# Patient Record
Sex: Female | Born: 1980 | Hispanic: Yes | Marital: Married | Smoking: Never smoker
Health system: Southern US, Community
[De-identification: ages and names within clinical notes are randomized; demographics above are authoritative.]

## PROBLEM LIST (undated history)

## (undated) DIAGNOSIS — M419 Scoliosis, unspecified: Secondary | ICD-10-CM

## (undated) HISTORY — DX: Scoliosis, unspecified: M41.9

## (undated) HISTORY — PX: TUBAL LIGATION: SHX77

---

## 2010-10-16 ENCOUNTER — Emergency Department: Payer: Self-pay | Admitting: Emergency Medicine

## 2018-04-09 ENCOUNTER — Ambulatory Visit
Admission: RE | Admit: 2018-04-09 | Discharge: 2018-04-09 | Disposition: A | Discharge status: Home / Self Care | Payer: Self-pay | Source: Ambulatory Visit | Attending: Student | Admitting: Student

## 2018-04-09 ENCOUNTER — Other Ambulatory Visit: Payer: Self-pay | Admitting: Student

## 2018-04-09 DIAGNOSIS — M545 Low back pain, unspecified: Secondary | ICD-10-CM

## 2018-04-09 DIAGNOSIS — M419 Scoliosis, unspecified: Secondary | ICD-10-CM

## 2018-04-09 DIAGNOSIS — G8929 Other chronic pain: Secondary | ICD-10-CM | POA: Insufficient documentation

## 2018-04-09 DIAGNOSIS — M5442 Lumbago with sciatica, left side: Secondary | ICD-10-CM

## 2020-06-18 ENCOUNTER — Other Ambulatory Visit: Payer: Self-pay | Admitting: Nurse Practitioner

## 2020-06-18 DIAGNOSIS — M419 Scoliosis, unspecified: Secondary | ICD-10-CM

## 2020-08-02 ENCOUNTER — Ambulatory Visit
Admission: RE | Admit: 2020-08-02 | Discharge: 2020-08-02 | Disposition: A | Discharge status: Home / Self Care | Payer: Self-pay | Source: Ambulatory Visit | Attending: Nurse Practitioner | Admitting: Nurse Practitioner

## 2020-08-02 DIAGNOSIS — M419 Scoliosis, unspecified: Secondary | ICD-10-CM | POA: Insufficient documentation

## 2021-08-09 ENCOUNTER — Other Ambulatory Visit: Payer: Self-pay | Admitting: Neurosurgery

## 2021-08-09 DIAGNOSIS — M419 Scoliosis, unspecified: Secondary | ICD-10-CM

## 2021-08-30 ENCOUNTER — Ambulatory Visit
Admission: RE | Admit: 2021-08-30 | Discharge: 2021-08-30 | Disposition: A | Discharge status: Home / Self Care | Payer: Self-pay | Attending: Neurosurgery | Admitting: Neurosurgery

## 2021-08-30 ENCOUNTER — Ambulatory Visit
Admission: RE | Admit: 2021-08-30 | Discharge: 2021-08-30 | Disposition: A | Discharge status: Home / Self Care | Payer: Self-pay | Source: Ambulatory Visit | Attending: Neurosurgery | Admitting: Neurosurgery

## 2021-08-30 DIAGNOSIS — M419 Scoliosis, unspecified: Secondary | ICD-10-CM

## 2021-09-18 ENCOUNTER — Other Ambulatory Visit: Payer: Self-pay | Admitting: Internal Medicine

## 2021-09-18 ENCOUNTER — Other Ambulatory Visit: Payer: Self-pay | Admitting: Obstetrics and Gynecology

## 2021-09-18 DIAGNOSIS — Z1231 Encounter for screening mammogram for malignant neoplasm of breast: Secondary | ICD-10-CM

## 2021-09-27 ENCOUNTER — Ambulatory Visit
Admission: RE | Admit: 2021-09-27 | Discharge: 2021-09-27 | Disposition: A | Discharge status: Home / Self Care | Payer: Self-pay | Source: Ambulatory Visit | Attending: Obstetrics and Gynecology | Admitting: Obstetrics and Gynecology

## 2021-09-27 ENCOUNTER — Other Ambulatory Visit: Payer: Self-pay | Admitting: Obstetrics and Gynecology

## 2021-09-27 ENCOUNTER — Encounter: Payer: Self-pay | Admitting: Radiology

## 2021-09-27 DIAGNOSIS — Z1231 Encounter for screening mammogram for malignant neoplasm of breast: Secondary | ICD-10-CM | POA: Insufficient documentation

## 2021-10-03 ENCOUNTER — Other Ambulatory Visit: Payer: Self-pay | Admitting: Obstetrics and Gynecology

## 2021-10-03 DIAGNOSIS — R928 Other abnormal and inconclusive findings on diagnostic imaging of breast: Secondary | ICD-10-CM

## 2021-10-03 DIAGNOSIS — N6489 Other specified disorders of breast: Secondary | ICD-10-CM

## 2021-10-16 ENCOUNTER — Ambulatory Visit
Admission: RE | Admit: 2021-10-16 | Discharge: 2021-10-16 | Disposition: A | Discharge status: Home / Self Care | Payer: Self-pay | Source: Ambulatory Visit | Attending: Obstetrics and Gynecology | Admitting: Obstetrics and Gynecology

## 2021-10-16 DIAGNOSIS — N6489 Other specified disorders of breast: Secondary | ICD-10-CM | POA: Insufficient documentation

## 2021-10-16 DIAGNOSIS — R928 Other abnormal and inconclusive findings on diagnostic imaging of breast: Secondary | ICD-10-CM

## 2022-04-03 ENCOUNTER — Other Ambulatory Visit: Payer: Self-pay | Admitting: Obstetrics and Gynecology

## 2022-04-03 DIAGNOSIS — N6489 Other specified disorders of breast: Secondary | ICD-10-CM

## 2022-04-25 ENCOUNTER — Ambulatory Visit
Admission: RE | Admit: 2022-04-25 | Discharge: 2022-04-25 | Disposition: A | Discharge status: Home / Self Care | Payer: Self-pay | Source: Ambulatory Visit | Attending: Obstetrics and Gynecology | Admitting: Obstetrics and Gynecology

## 2022-04-25 DIAGNOSIS — N6489 Other specified disorders of breast: Secondary | ICD-10-CM

## 2022-08-06 ENCOUNTER — Telehealth: Payer: Self-pay

## 2022-08-06 DIAGNOSIS — M419 Scoliosis, unspecified: Secondary | ICD-10-CM

## 2022-08-06 NOTE — Telephone Encounter (Signed)
The patient is due to have her scoliosis xrays repeated in March 2024. Can you please let her know that [place] [facility], she will not need an appt and can go anytime Monday through Friday, 8am to 5pm.

## 2022-08-07 NOTE — Telephone Encounter (Signed)
Patient is aware to go for her xrays in March. We will then call her with the results or to schedule in-person visit.

## 2022-09-03 NOTE — Telephone Encounter (Signed)
No xrays yet.

## 2022-09-12 ENCOUNTER — Ambulatory Visit
Admission: RE | Admit: 2022-09-12 | Discharge: 2022-09-12 | Disposition: A | Discharge status: Home / Self Care | Payer: Self-pay | Attending: Neurosurgery | Admitting: Neurosurgery

## 2022-09-12 ENCOUNTER — Ambulatory Visit
Admission: RE | Admit: 2022-09-12 | Discharge: 2022-09-12 | Disposition: A | Discharge status: Home / Self Care | Payer: Self-pay | Source: Ambulatory Visit | Attending: Neurosurgery | Admitting: Neurosurgery

## 2022-09-12 DIAGNOSIS — M419 Scoliosis, unspecified: Secondary | ICD-10-CM | POA: Insufficient documentation

## 2022-09-15 NOTE — Telephone Encounter (Signed)
Patient has had her repeat scoliosis xrays that were due to be repeated in March 2024.

## 2022-09-15 NOTE — Telephone Encounter (Signed)
She had her xrays on 09/12/2022

## 2022-09-17 NOTE — Telephone Encounter (Signed)
Left message for patient to call the office for a telephone or in-person visit.

## 2022-09-18 NOTE — Telephone Encounter (Signed)
She requested to come in person to review xray results 09/23/2022.

## 2022-09-23 ENCOUNTER — Encounter: Payer: Self-pay | Admitting: Neurosurgery

## 2022-09-23 ENCOUNTER — Ambulatory Visit (INDEPENDENT_AMBULATORY_CARE_PROVIDER_SITE_OTHER): Payer: Self-pay | Admitting: Neurosurgery

## 2022-09-23 VITALS — BP 115/76 | HR 67 | Ht 59.0 in | Wt 153.0 lb

## 2022-09-23 DIAGNOSIS — M41117 Juvenile idiopathic scoliosis, lumbosacral region: Secondary | ICD-10-CM

## 2022-09-23 NOTE — Progress Notes (Signed)
Referring Physician:  No referring provider defined for this encounter.  Primary Physician:  Tracie Harrier, MD  History of Present Illness: 09/23/2022 Claudia Johnson is here today with a chief complaint of known scoliosis.  She is having slight increase in her back discomfort, but is still able to do what she needs to do.    I have utilized the care everywhere function in epic to review the outside records available from external health systems.  09/13/2021 Claudia Johnson returns to see me. She continues to have some back pain. She has occasional pain in the left lower back particularly at the end of the day. Overall, she is not currently limited in her day-to-day life.  09/06/2020 Claudia Johnson is here today with a chief complaint of low back pain and left buttock pain that radiates into the groin and down the anterior leg into the foot.   She has had pain for a year. She is known she had scoliosis for some time. Her pain can be as bad as 8 out of 10 is made worse by walking. She is able to walk approximately 2 Johnson before her pain sets in. Prior to that, she is relatively pain-free. She is able to do everything she currently needs to do.  Conservative measures:  Physical therapy: has not participated in Multimodal medical therapy including regular antiinflammatories: meloxicam,flexeril Injections: has not had epidural steroid injections  Past Surgery: none   Review of Systems:  A 10 point review of systems is negative, except for the pertinent positives and negatives detailed in the HPI.  Past Medical History: No past medical history on file.  Past Surgical History: No past surgical history on file.  Allergies: Allergies as of 09/23/2022   (Not on File)    Medications: Current Meds  Medication Sig   baclofen (LIORESAL) 10 MG tablet Take 10 mg by mouth 3 (three) times daily.   Vitamin D, Ergocalciferol, (DRISDOL) 1.25 MG (50000 UNIT) CAPS capsule  Take 50,000 Units by mouth once a week.    Social History: Social History   Tobacco Use   Smoking status: Never   Smokeless tobacco: Never    Family Medical History: No family history on file.  Physical Examination: Vitals:   09/23/22 1035  BP: 115/76  Pulse: 67  SpO2: 99%    General: Patient is well developed, well nourished, calm, collected, and in no apparent distress. Attention to examination is appropriate.  Neck:   Supple.  Full range of motion.  Respiratory: Patient is breathing without any difficulty.   NEUROLOGICAL:     Awake, alert, oriented to person, place, and time.  Speech is clear and fluent.   Cranial Nerves: Pupils equal round and reactive to light.  Facial tone is symmetric.  Facial sensation is symmetric. Shoulder shrug is symmetric. Tongue protrusion is midline.  There is no pronator drift.  ROM of spine: full.    Strength: Side Biceps Triceps Deltoid Interossei Grip Wrist Ext. Wrist Flex.  R 5 5 5 5 5 5 5   L 5 5 5 5 5 5 5    Side Iliopsoas Quads Hamstring PF DF EHL  R 5 5 5 5 5 5   L 5 5 5 5 5 5    Reflexes are 1+ and symmetric at the biceps, triceps, brachioradialis, patella and achilles.   Hoffman's is absent.   Bilateral upper and lower extremity sensation is intact to light touch.    No evidence of dysmetria noted.  Gait  is normal.     Medical Decision Making  Imaging: Scoliosis xrays 09/16/2022 IMPRESSION: 1. Loss of normal cervical lordosis again noted. 2. Stable 25 degree scoliosis lumbar spine concave right. Stable 11 degree scoliosis concave left lower thoracic spine. 3. 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 4. No acute bony abnormalities.  Exam is unchanged from prior exam.     Electronically Signed   By: Marcello Moores  Register M.D.   On: 09/16/2022 06:37      I have personally reviewed the images and agree with the above interpretation.  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  She is having slightly more symptoms than she did previously.  However, she is still relatively asymptomatic.  Will follow-up with x-rays in 1 year.  I will see her back after her x-rays.  If her pain worsens in the meantime, we will start her on physical therapy.    Thank you for involving me in the care of this patient.      Claudia Johnson K. Izora Ribas MD, [facility] Neurosurgery

## 2022-10-09 ENCOUNTER — Other Ambulatory Visit: Payer: Self-pay

## 2022-10-09 DIAGNOSIS — N6489 Other specified disorders of breast: Secondary | ICD-10-CM

## 2022-10-28 ENCOUNTER — Other Ambulatory Visit: Payer: Self-pay | Admitting: Obstetrics and Gynecology

## 2022-10-28 ENCOUNTER — Ambulatory Visit: Payer: Self-pay | Attending: Hematology and Oncology | Admitting: Hematology and Oncology

## 2022-10-28 ENCOUNTER — Ambulatory Visit
Admission: RE | Admit: 2022-10-28 | Discharge: 2022-10-28 | Disposition: A | Discharge status: Home / Self Care | Payer: Self-pay | Source: Ambulatory Visit | Attending: Obstetrics and Gynecology | Admitting: Obstetrics and Gynecology

## 2022-10-28 VITALS — BP 106/68 | Wt 155.1 lb

## 2022-10-28 DIAGNOSIS — N63 Unspecified lump in unspecified breast: Secondary | ICD-10-CM

## 2022-10-28 DIAGNOSIS — N6489 Other specified disorders of breast: Secondary | ICD-10-CM

## 2022-10-28 DIAGNOSIS — R928 Other abnormal and inconclusive findings on diagnostic imaging of breast: Secondary | ICD-10-CM

## 2022-10-28 NOTE — Progress Notes (Signed)
Ms. Claudia Johnson is a 42 y.o. female who presents to [facility] clinic today with no complaints. Scheduled follow up of probable benign right breast mass.   Pap Smear: Pap not smear completed today. Last Pap smear was 09/14/2020 at [facility] clinic and was normal. Per patient has no history of an abnormal Pap smear. Last Pap smear result is available in Epic.   Physical exam: Breasts Breasts symmetrical. No skin abnormalities bilateral breasts. No nipple retraction bilateral breasts. No nipple discharge bilateral breasts. No lymphadenopathy. No lumps palpated left breasts. Right breast with mass noted at 10 o'clock.  MM DIAG BREAST TOMO UNI RIGHT  Result Date: 04/25/2022 CLINICAL DATA:  42 year old female presenting for first six-month follow-up of a probably benign right breast mass. EXAM: DIGITAL DIAGNOSTIC UNILATERAL RIGHT MAMMOGRAM WITH TOMOSYNTHESIS; ULTRASOUND RIGHT BREAST LIMITED TECHNIQUE: Right digital diagnostic mammography and breast tomosynthesis was performed.; Targeted ultrasound examination of the right breast was performed COMPARISON:  Previous exam(s). ACR Breast Density Category c: The breast tissue is heterogeneously dense, which may obscure small masses. FINDINGS: Stable appearance of a circumscribed mass in the upper outer right breast at anterior depth. Otherwise, no new or suspicious findings. The parenchymal pattern is stable. Targeted ultrasound is performed, showing grossly stable appearance of an oval, circumscribed hypoechoic mass at the 10 o'clock position 3 cm from the nipple. Today it measures 10 x 5 x 10 mm (previously 8 x 5 x 9 mm). Slight variations are due to differences in positioning and technique. IMPRESSION: Stable, probably benign right breast mass. Recommend continued short-term follow-up. RECOMMENDATION: Bilateral diagnostic mammogram and right breast ultrasound in 6 months. I have discussed the findings and recommendations with the patient. If applicable, a reminder  letter will be sent to the patient regarding the next appointment. BI-RADS CATEGORY  3: Probably benign. Electronically Signed   By: Sande Brothers M.D.   On: 04/25/2022 15:30  MM DIAG BREAST TOMO UNI RIGHT  Result Date: 10/16/2021 CLINICAL DATA:  Callback for RIGHT breast asymmetry off of baseline mammogram EXAM: DIGITAL DIAGNOSTIC UNILATERAL RIGHT MAMMOGRAM WITH TOMOSYNTHESIS AND CAD; ULTRASOUND RIGHT BREAST LIMITED TECHNIQUE: Right digital diagnostic mammography and breast tomosynthesis was performed. The images were evaluated with computer-aided detection.; Targeted ultrasound examination of the right breast was performed COMPARISON:  Previous exam. ACR Breast Density Category c: The breast tissue is heterogeneously dense, which may obscure small masses. FINDINGS: Diagnostic images demonstrate an oval circumscribed mass in the RIGHT upper outer breast at anterior to middle depth. Is best seen on ML slice 51. On physical exam, no suspicious mass is appreciated. Targeted ultrasound was performed of the RIGHT upper outer breast. At 10 o'clock 3 cm from the nipple, there is an oval circumscribed hypoechoic mass. It measures 8 x 5 by 9 mm. This likely corresponds to the site of screening mammographic concern. There is a collection of tubular anechoic masses with focal anechoic outpouchings consistent with benign duct ectasia noted at 1030 3 cm from the nipple immediately adjacent to this mass. IMPRESSION: 1. There is a probably benign asymmetry best seen on ML imaging with a probably benign favored sonographic correlate at 10 o'clock 3 cm from the nipple. Recommend follow-up RIGHT breast mammogram and ultrasound in 6 months. This will establish 6 months of definitive stability from baseline mammogram. 2. 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 RECOMMENDATION: RIGHT diagnostic mammogram and RIGHT breast ultrasound in 6 months. I have discussed the findings and recommendations with the patient.  If applicable, a  reminder letter will be sent to the patient regarding the next appointment. BI-RADS CATEGORY  3: Probably benign. Electronically Signed   By: Meda Klinefelter M.D.   On: 10/16/2021 09:43  MM 3D SCREEN BREAST BILATERAL  Result Date: 09/27/2021 CLINICAL DATA:  Screening. EXAM: DIGITAL SCREENING BILATERAL MAMMOGRAM WITH TOMOSYNTHESIS AND CAD TECHNIQUE: Bilateral screening digital craniocaudal and mediolateral oblique mammograms were obtained. Bilateral screening digital breast tomosynthesis was performed. The images were evaluated with computer-aided detection. COMPARISON:  None. ACR Breast Density Category c: The breast tissue is heterogeneously dense, which may obscure small masses. FINDINGS: In the right breast, a possible asymmetry warrants further evaluation. In the left breast, no findings suspicious for malignancy. IMPRESSION: Further evaluation is suggested for possible asymmetry in the right breast. RECOMMENDATION: Diagnostic mammogram and possibly ultrasound of the right breast. (Code:[id number]) The patient will be contacted regarding the findings, and additional imaging will be scheduled. BI-RADS CATEGORY  0: Incomplete. Need additional imaging evaluation and/or prior mammograms for comparison. Electronically Signed   By: Elberta Fortis M.D.   On: 09/27/2021 14:59         Pelvic/Bimanual Pap is not indicated today    Smoking History: Patient has never smoked and was not referred to quit line.    Patient Navigation: Patient education provided. Access to services provided for patient through [facility] program. Delos Haring interpreter provided. No transportation provided   Colorectal Cancer Screening: Per patient, has never had colonoscopy.  No complaints today.    Breast and Cervical Cancer Risk Assessment: Patient does not have family history of breast cancer, known genetic mutations, or radiation treatment to the chest before age 61. Patient does not have history of cervical  dysplasia, immunocompromised, or DES exposure in-utero.  Risk Assessment   No risk assessment data     A: [facility] exam without pap smear No complaints. Breast exam with known right breast mass.   P: [facility]. Appointment scheduled 10/28/22.  Pascal Lux, NP 10/28/2022 9:03 AM

## 2022-10-28 NOTE — Patient Instructions (Signed)
Taught Claudia Johnson about self breast awareness and gave educational materials to take home. Patient did not need a Pap smear today due to last Pap smear was in 09/23/20 per patient. Let her know [facility] will cover Pap smears every 5 years unless has a history of abnormal Pap smears. Referred patient to [facility] for diagnostic mammogram. Appointment scheduled for 10/28/22. Patient aware of appointment and will be there. Let patient know will follow up with her within the next couple weeks with results. Claudia Johnson verbalized understanding.  Claudia Lux, NP 9:05 AM

## 2022-11-05 ENCOUNTER — Ambulatory Visit
Admission: RE | Admit: 2022-11-05 | Discharge: 2022-11-05 | Disposition: A | Discharge status: Home / Self Care | Payer: Self-pay | Source: Ambulatory Visit | Attending: Obstetrics and Gynecology | Admitting: Obstetrics and Gynecology

## 2022-11-05 DIAGNOSIS — R928 Other abnormal and inconclusive findings on diagnostic imaging of breast: Secondary | ICD-10-CM | POA: Insufficient documentation

## 2022-11-05 DIAGNOSIS — N63 Unspecified lump in unspecified breast: Secondary | ICD-10-CM | POA: Insufficient documentation

## 2022-11-05 HISTORY — PX: BREAST BIOPSY: SHX20

## 2022-11-05 MED ORDER — LIDOCAINE HCL (PF) 1 % IJ SOLN
2.0000 mL | Freq: Once | INTRAMUSCULAR | Status: AC
  Administered 2022-11-05: 2 mL via INTRADERMAL
  Filled 2022-11-05: qty 2

## 2022-11-05 MED ORDER — LIDOCAINE-EPINEPHRINE 1 %-1:100000 IJ SOLN
10.0000 mL | Freq: Once | INTRAMUSCULAR | Status: AC
  Administered 2022-11-05: 10 mL via INTRADERMAL
  Filled 2022-11-05: qty 10

## 2022-11-06 LAB — SURGICAL PATHOLOGY

## 2023-04-05 IMAGING — MG MM DIGITAL DIAGNOSTIC UNILAT*R* W/ TOMO W/ CAD
4 series · 4 of 12 positions shown · non-contrast
Comparison: Previous exam.

CLINICAL DATA: Callback for RIGHT breast asymmetry off of baseline
mammogram

EXAM:
DIGITAL DIAGNOSTIC UNILATERAL RIGHT MAMMOGRAM WITH TOMOSYNTHESIS AND
CAD; ULTRASOUND RIGHT BREAST LIMITED
TECHNIQUE: Right digital diagnostic mammography and breast tomosynthesis was
performed. The images were evaluated with computer-aided detection.;
Targeted ultrasound examination of the right breast was performed

[R ML synth-2D]
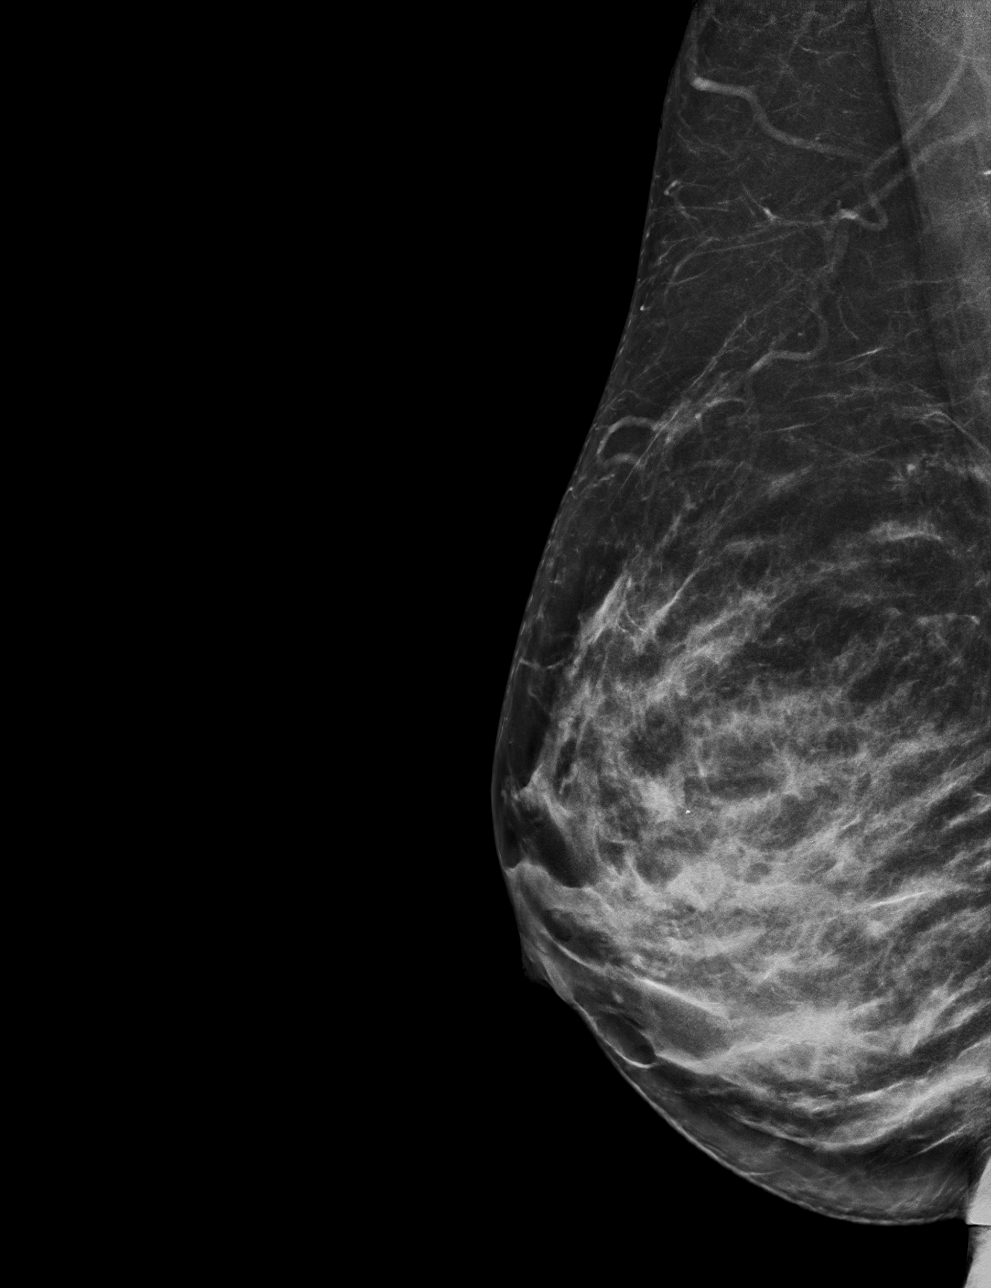

[R MLO synth-2D]
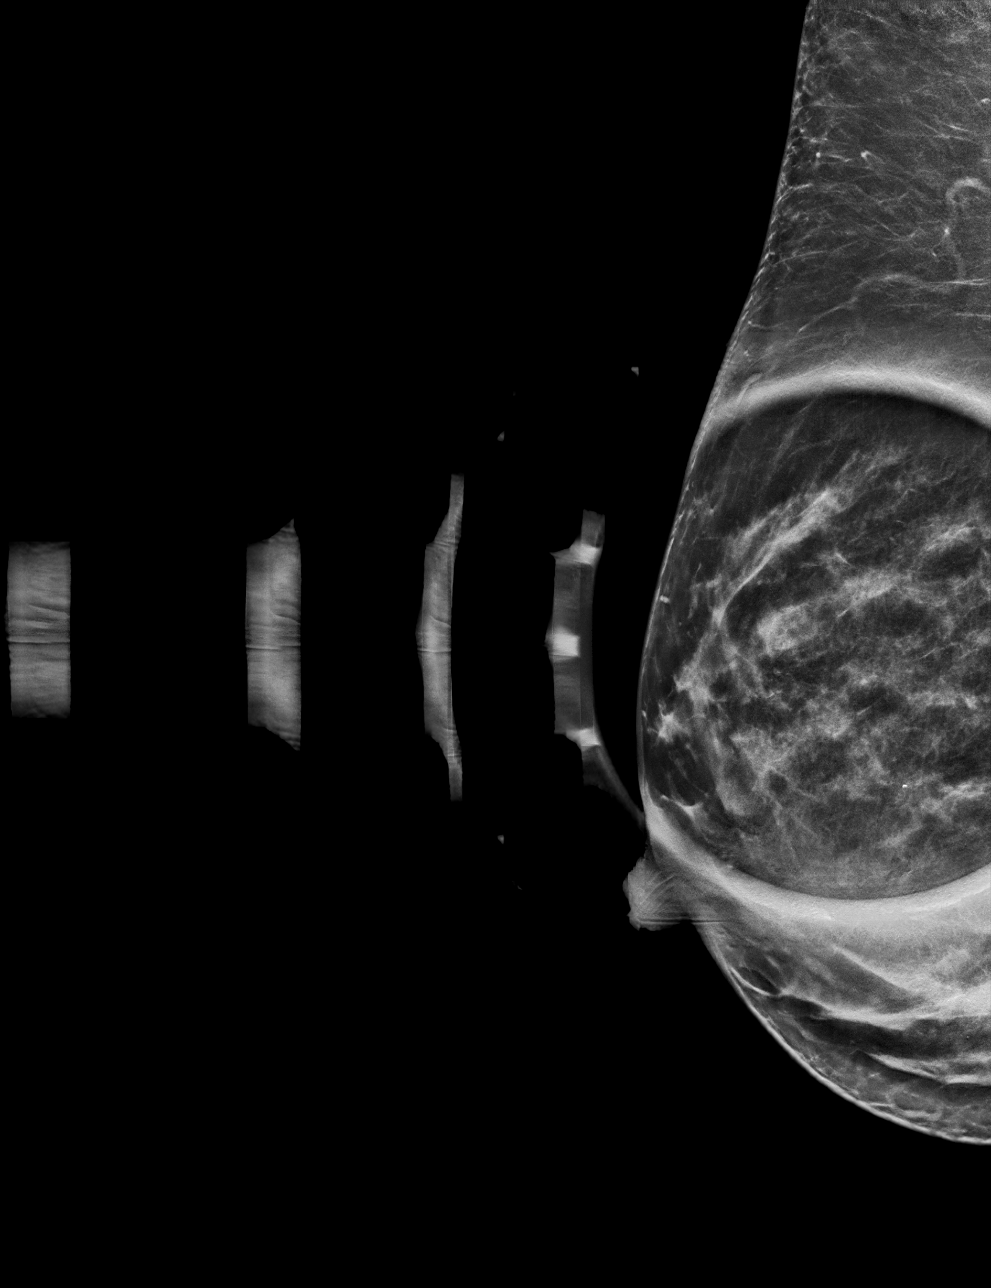

[R MLO tomo · tomo slice 31/62.0]
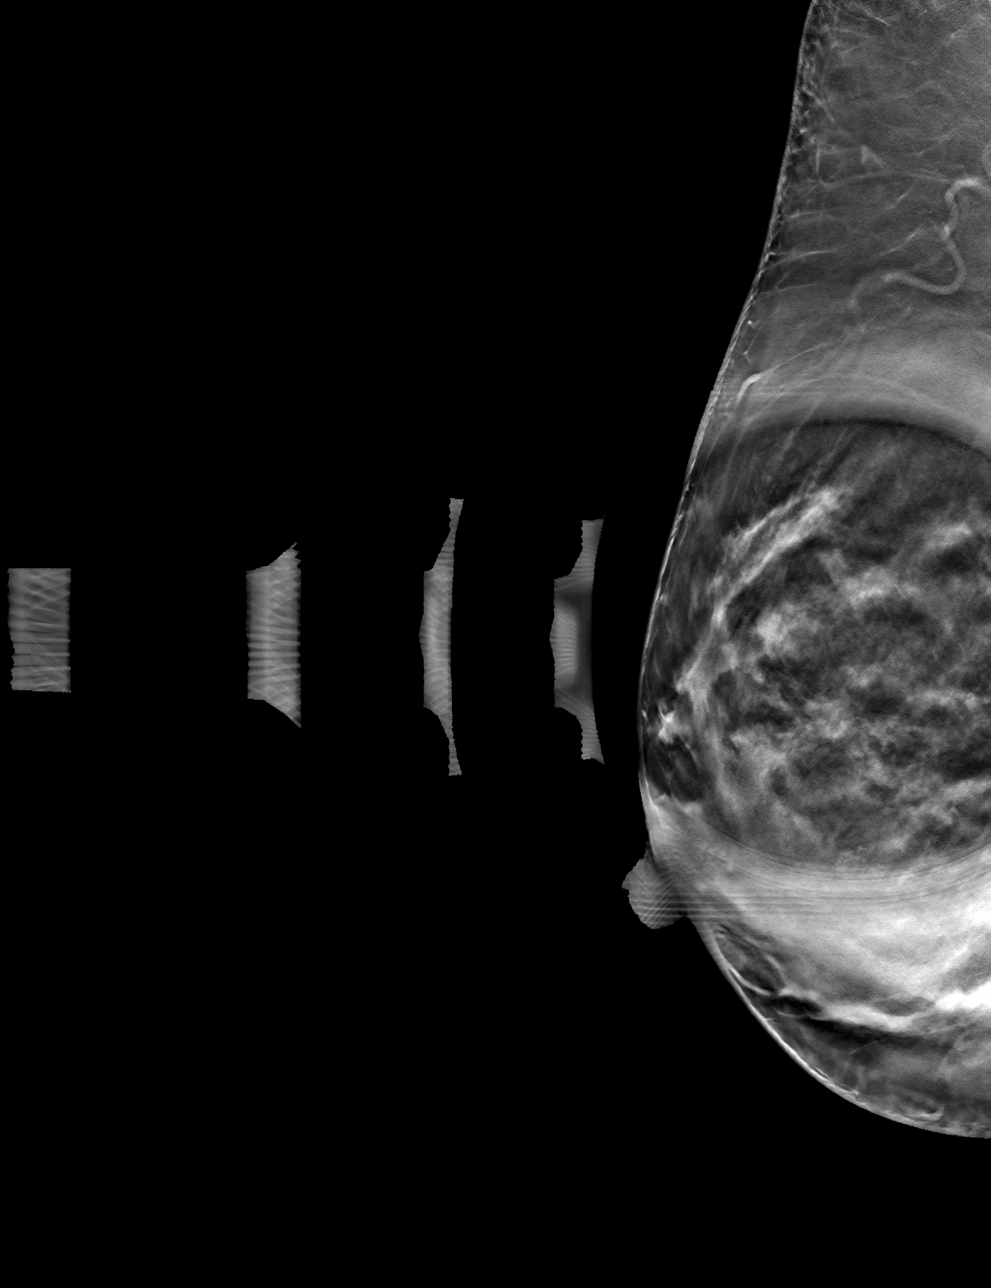

[R ML tomo · tomo slice 33/66.0]
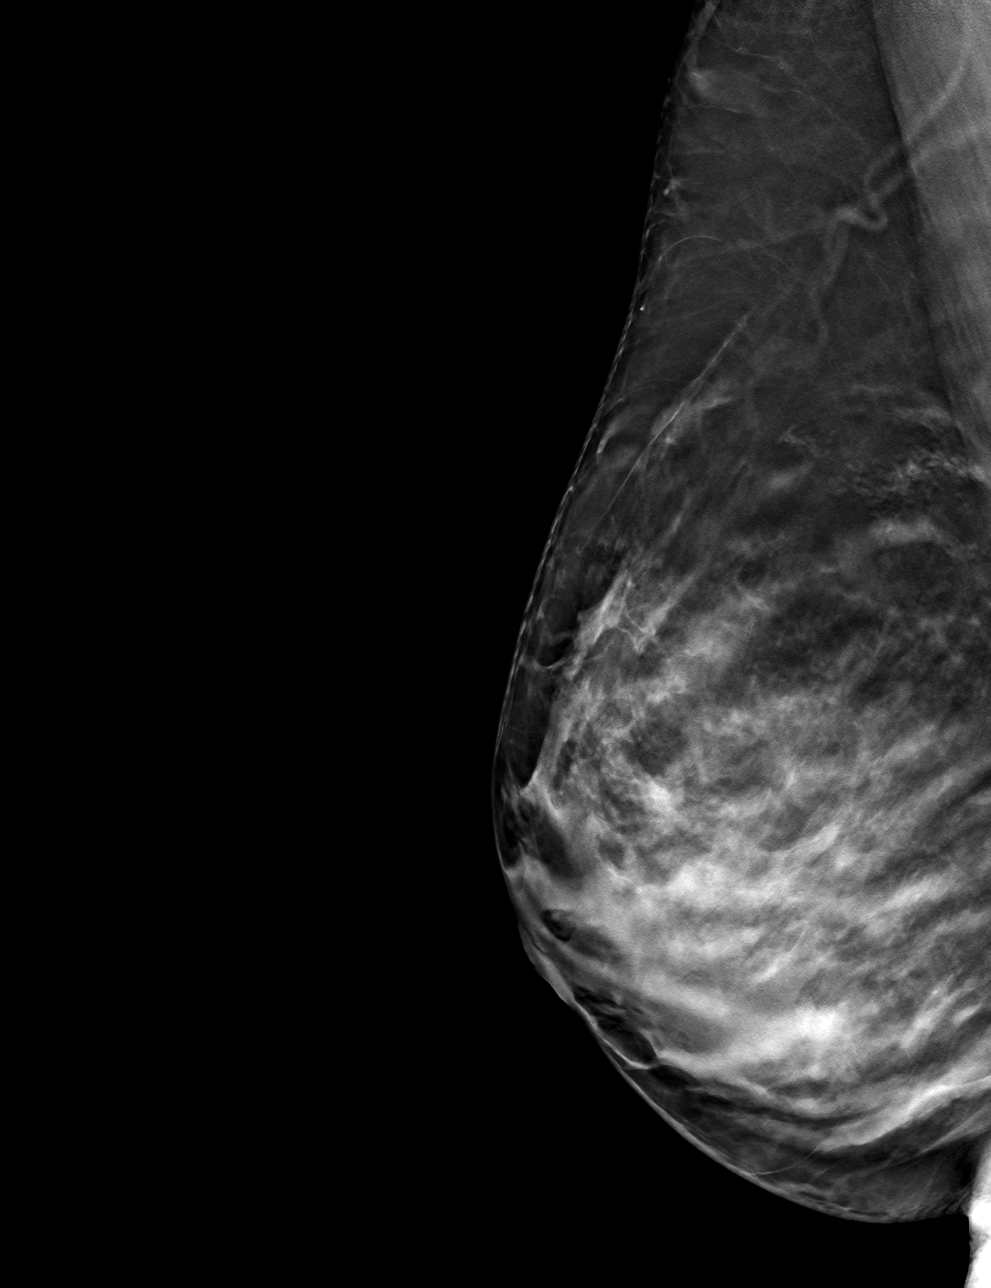

[4 of 12 positions shown; findings below may reference images not displayed]

ACR Breast Density Category c: The breast tissue is heterogeneously
dense, which may obscure small masses.
FINDINGS: Diagnostic images demonstrate an oval circumscribed mass in the
RIGHT upper outer breast at anterior to middle depth. Is best seen
on ML slice 51.

On physical exam, no suspicious mass is appreciated.

Targeted ultrasound was performed of the RIGHT upper outer breast.
At 10 o'clock 3 cm from the nipple, there is an oval circumscribed
hypoechoic mass. It measures 8 x 5 by 9 mm. This likely corresponds
to the site of screening mammographic concern. There is a collection
of tubular anechoic masses with focal anechoic outpouchings
consistent with benign duct ectasia noted at 7737 3 cm from the
nipple immediately adjacent to this mass.
IMPRESSION: 1. There is a probably benign asymmetry best seen on ML imaging with
a probably benign favored sonographic correlate at 10 o'clock 3 cm
from the nipple. Recommend follow-up RIGHT breast mammogram and
ultrasound in 6 months. This will establish 6 months of definitive
stability from baseline mammogram.
2. There is benign duct ectasia noted within the RIGHT breast.

RECOMMENDATION:
RIGHT diagnostic mammogram and RIGHT breast ultrasound in 6 months.

I have discussed the findings and recommendations with the patient.
If applicable, a reminder letter will be sent to the patient
regarding the next appointment.

BI-RADS CATEGORY  3: Probably benign.

## 2023-04-05 IMAGING — US US BREAST*R* LIMITED INC AXILLA
1 series · 13 of 13 positions shown · non-contrast
Comparison: Previous exam.

CLINICAL DATA: Callback for RIGHT breast asymmetry off of baseline
mammogram

EXAM:
DIGITAL DIAGNOSTIC UNILATERAL RIGHT MAMMOGRAM WITH TOMOSYNTHESIS AND
CAD; ULTRASOUND RIGHT BREAST LIMITED
TECHNIQUE: Right digital diagnostic mammography and breast tomosynthesis was
performed. The images were evaluated with computer-aided detection.;
Targeted ultrasound examination of the right breast was performed

[Series 1: us breast*right* limited inc axilla · 0.06mm/px · 13 of 13 slices shown]
[im 1/13]
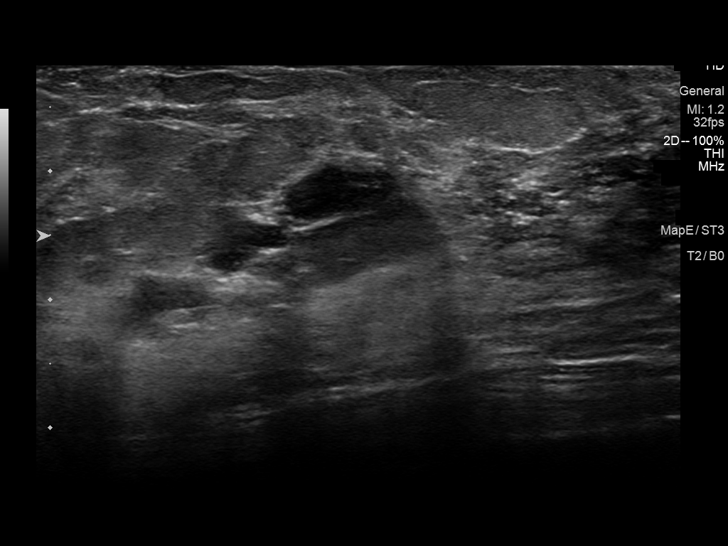
[im 2/13]
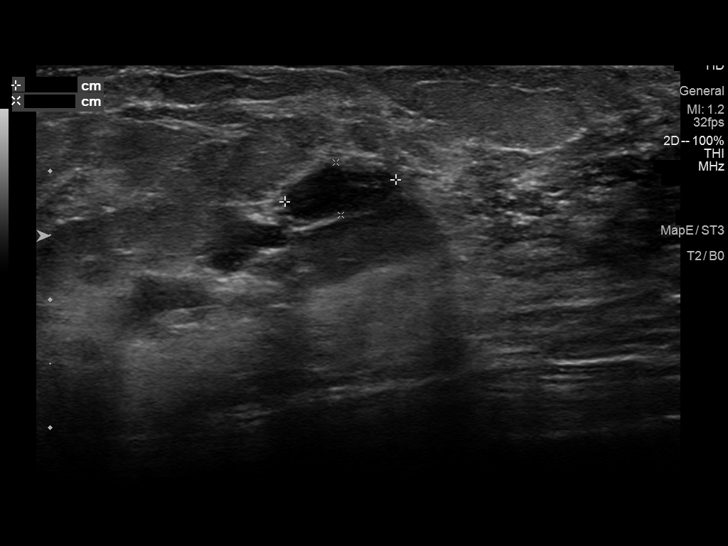
[im 3/13]
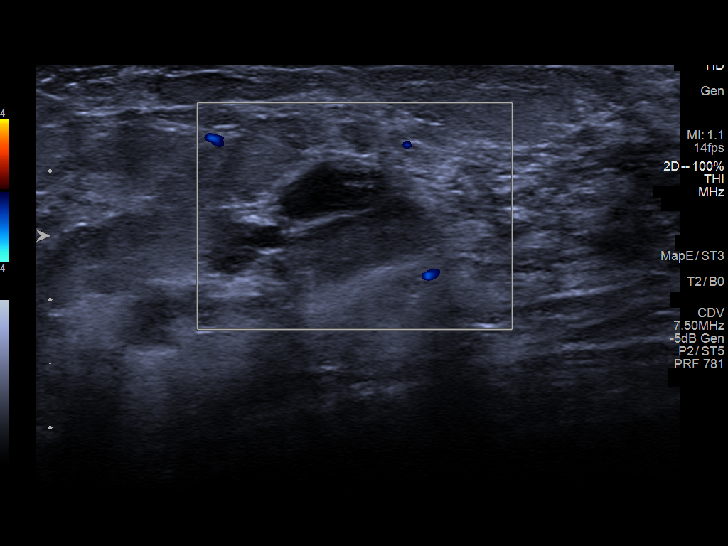
[im 4/13]
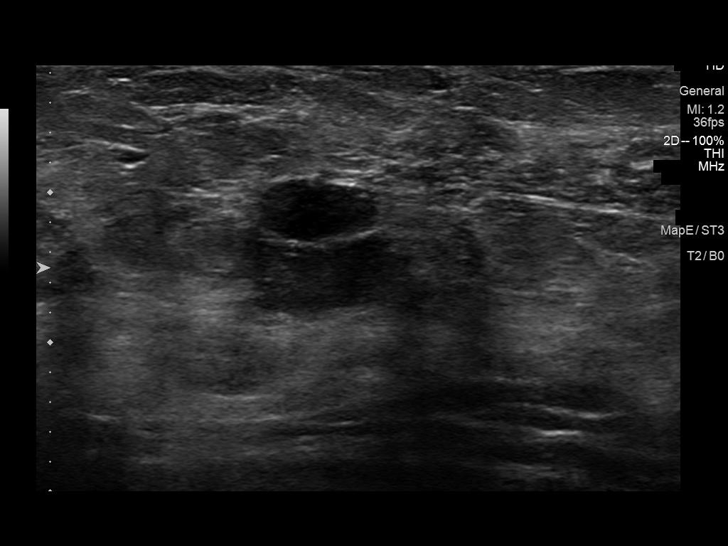
[im 5/13]
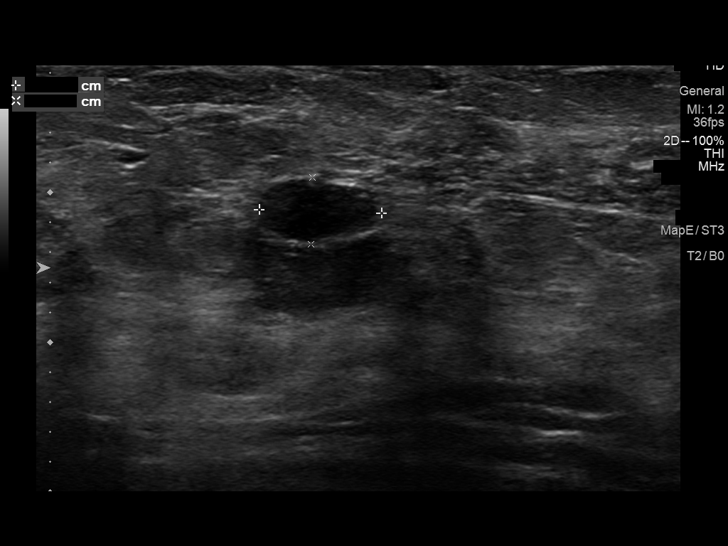
[im 6/13]
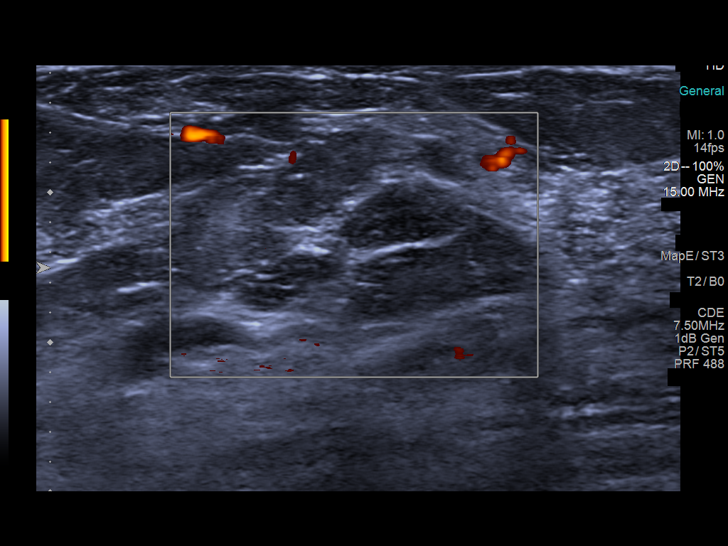
[im 7/13]
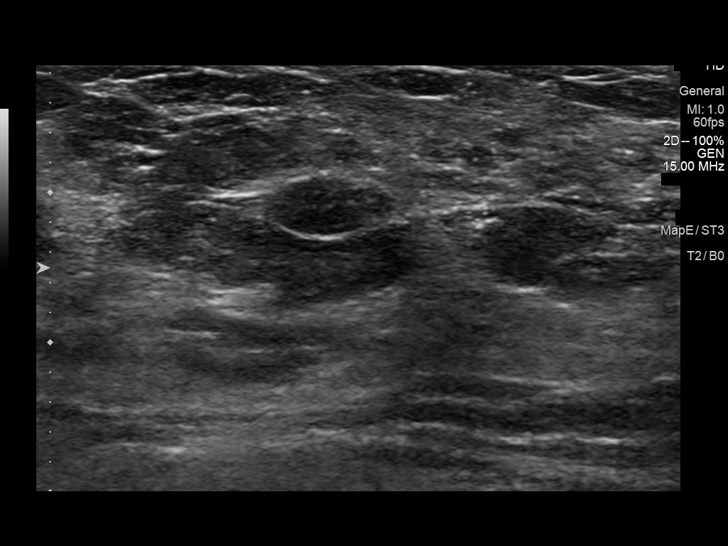
[im 8/13]
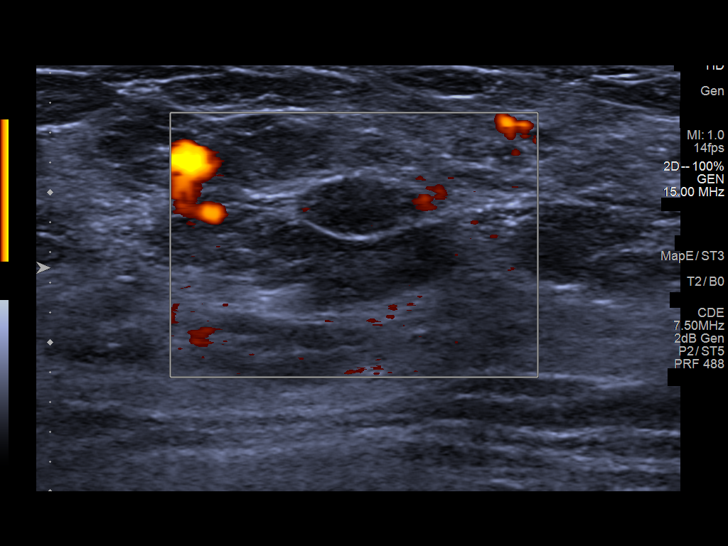
[im 9/13]
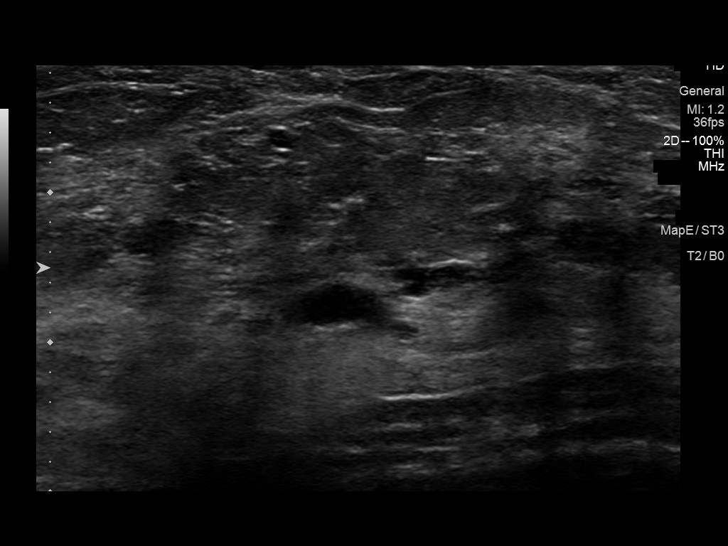
[im 10/13]
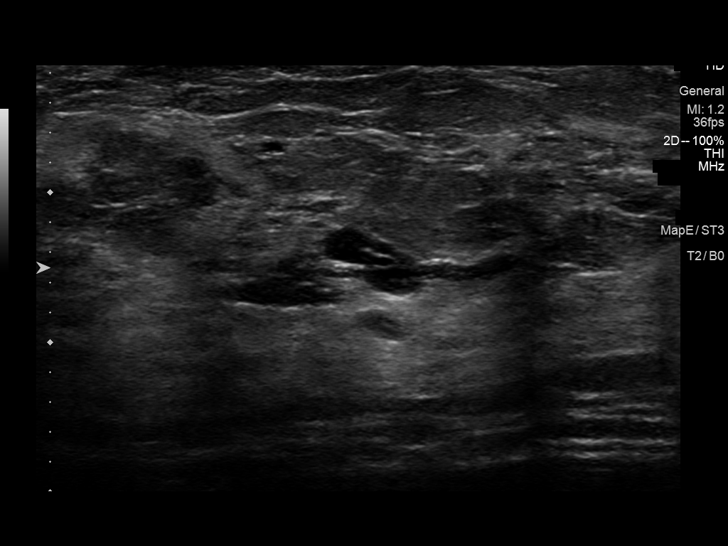
[im 11/13]
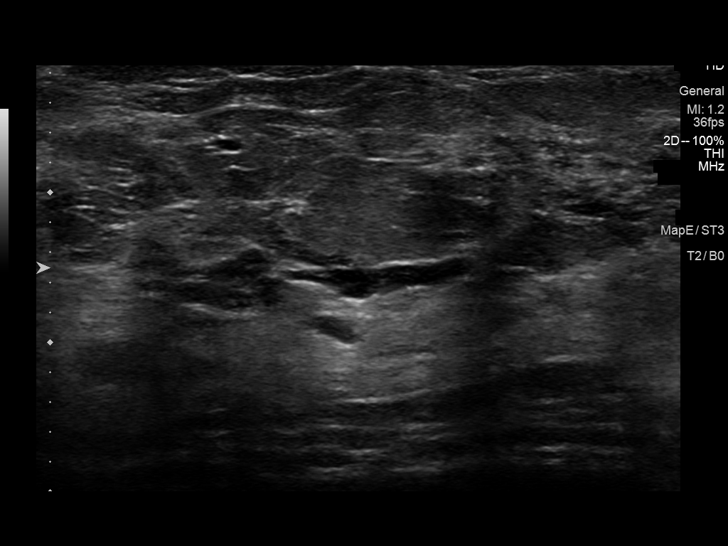
[im 12/13]
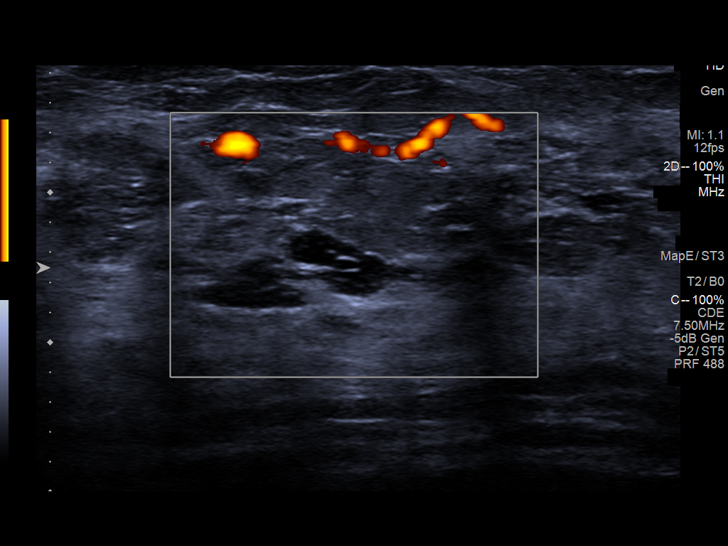
[im 13/13]
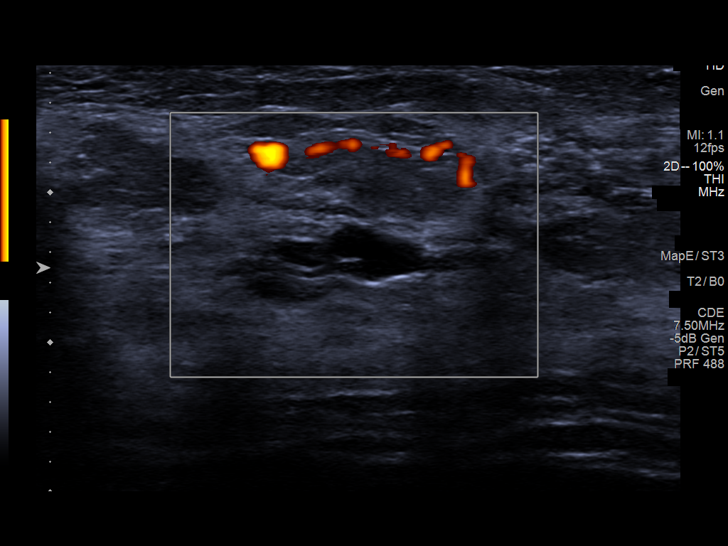

[13 of 13 positions shown; findings below may reference images not displayed]

ACR Breast Density Category c: The breast tissue is heterogeneously
dense, which may obscure small masses.
FINDINGS: Diagnostic images demonstrate an oval circumscribed mass in the
RIGHT upper outer breast at anterior to middle depth. Is best seen
on ML slice 51.

On physical exam, no suspicious mass is appreciated.

Targeted ultrasound was performed of the RIGHT upper outer breast.
At 10 o'clock 3 cm from the nipple, there is an oval circumscribed
hypoechoic mass. It measures 8 x 5 by 9 mm. This likely corresponds
to the site of screening mammographic concern. There is a collection
of tubular anechoic masses with focal anechoic outpouchings
consistent with benign duct ectasia noted at 7737 3 cm from the
nipple immediately adjacent to this mass.
IMPRESSION: 1. There is a probably benign asymmetry best seen on ML imaging with
a probably benign favored sonographic correlate at 10 o'clock 3 cm
from the nipple. Recommend follow-up RIGHT breast mammogram and
ultrasound in 6 months. This will establish 6 months of definitive
stability from baseline mammogram.
2. There is benign duct ectasia noted within the RIGHT breast.

RECOMMENDATION:
RIGHT diagnostic mammogram and RIGHT breast ultrasound in 6 months.

I have discussed the findings and recommendations with the patient.
If applicable, a reminder letter will be sent to the patient
regarding the next appointment.

BI-RADS CATEGORY  3: Probably benign.

## 2023-09-11 ENCOUNTER — Other Ambulatory Visit: Payer: Self-pay | Admitting: Obstetrics and Gynecology

## 2023-09-11 ENCOUNTER — Ambulatory Visit
Admission: RE | Admit: 2023-09-11 | Discharge: 2023-09-11 | Disposition: A | Discharge status: Home / Self Care | Payer: Self-pay | Attending: Neurosurgery | Admitting: Neurosurgery

## 2023-09-11 ENCOUNTER — Ambulatory Visit
Admission: RE | Admit: 2023-09-11 | Discharge: 2023-09-11 | Disposition: A | Discharge status: Home / Self Care | Payer: Self-pay | Source: Ambulatory Visit | Attending: Neurosurgery | Admitting: Neurosurgery

## 2023-09-11 DIAGNOSIS — M41117 Juvenile idiopathic scoliosis, lumbosacral region: Secondary | ICD-10-CM

## 2023-09-11 DIAGNOSIS — Z1231 Encounter for screening mammogram for malignant neoplasm of breast: Secondary | ICD-10-CM

## 2023-09-22 ENCOUNTER — Ambulatory Visit (INDEPENDENT_AMBULATORY_CARE_PROVIDER_SITE_OTHER): Payer: Self-pay | Admitting: Neurosurgery

## 2023-09-22 VITALS — BP 128/80 | Ht 59.0 in | Wt 149.0 lb

## 2023-09-22 DIAGNOSIS — M41117 Juvenile idiopathic scoliosis, lumbosacral region: Secondary | ICD-10-CM

## 2023-09-22 NOTE — Progress Notes (Signed)
 Referring Physician:  Barbette Reichmann, MD [address] [facility] [place],  [place] [address]  Primary Physician:  Barbette Reichmann, MD  History of Present Illness: 09/22/2023 She is doing reasonably well.  She is having pain in her left leg when she stands for long periods.  On most days, she has little pain.   09/23/2022 Claudia Johnson is here today with a chief complaint of known scoliosis.  She is having slight increase in her back discomfort, but is still able to do what she needs to do.    I have utilized the care everywhere function in epic to review the outside records available from external health systems.  09/13/2021 Claudia Johnson returns to see me. She continues to have some back pain. She has occasional pain in the left lower back particularly at the end of the day. Overall, she is not currently limited in her day-to-day life.  09/06/2020 Claudia Johnson is here today with a chief complaint of low back pain and left buttock pain that radiates into the groin and down the anterior leg into the foot.   She has had pain for a year. She is known she had scoliosis for some time. Her pain can be as bad as 8 out of 10 is made worse by walking. She is able to walk approximately 2 miles before her pain sets in. Prior to that, she is relatively pain-free. She is able to do everything she currently needs to do.  Conservative measures:  Physical therapy: has not participated in Multimodal medical therapy including regular antiinflammatories: meloxicam,flexeril Injections: has not had epidural steroid injections  Past Surgery: none   Review of Systems:  A 10 point review of systems is negative, except for the pertinent positives and negatives detailed in the HPI.  Past Medical History: Past Medical History:  Diagnosis Date   Scoliosis     Past Surgical History: Past Surgical History:  Procedure Laterality Date   BREAST BIOPSY Right  11/05/2022   [place] bx, venus marker, path pending   BREAST BIOPSY Right 11/05/2022   [place] RT BREAST BX W LOC DEV 1ST LESION IMG BX SPEC US GUIDE 11/05/2022 [facility]   TUBAL LIGATION      Allergies: Allergies as of 09/22/2023   (Not on File)    Medications: No outpatient medications have been marked as taking for the 09/22/23 encounter (Appointment) with Venetia Night, MD.    Social History: Social History   Tobacco Use   Smoking status: Never   Smokeless tobacco: Never  Vaping Use   Vaping status: Never Used  Substance Use Topics   Alcohol use: Not Currently   Drug use: Never    Family Medical History: Family History  Problem Relation Age of Onset   Hypertension Father     Physical Examination: There were no vitals filed for this visit.   General: Patient is well developed, well nourished, calm, collected, and in no apparent distress. Attention to examination is appropriate.  Neck:   Supple.  Full range of motion.  Respiratory: Patient is breathing without any difficulty.   NEUROLOGICAL:     Awake, alert, oriented to person, place, and time.  Speech is clear and fluent.   Cranial Nerves: Pupils equal round and reactive to light.  Facial tone is symmetric.  Facial sensation is symmetric. Shoulder shrug is symmetric. Tongue protrusion is midline.  There is no pronator drift.  ROM of spine: full.    Strength: Side Biceps  Triceps Deltoid Interossei Grip Wrist Ext. Wrist Flex.  R 5 5 5 5 5 5 5   L 5 5 5 5 5 5 5    Side Iliopsoas Quads Hamstring PF DF EHL  R 5 5 5 5 5 5   L 5 5 5 5 5 5    Reflexes are 1+ and symmetric at the biceps, triceps, brachioradialis, patella and achilles.   Hoffman's is absent.   Bilateral upper and lower extremity sensation is intact to light touch.    No evidence of dysmetria noted.  Gait is normal.     Medical Decision Making  Imaging: Scoliosis xrays 09/16/2022 IMPRESSION: 1. Loss of normal cervical lordosis again  noted. 2. Stable 25 degree scoliosis lumbar spine concave right. Stable 11 degree scoliosis concave left lower thoracic spine. 3. 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 4. No acute bony abnormalities.  Exam is unchanged from prior exam.     Electronically Signed   By: Maisie Fus  Register M.D.   On: 09/16/2022 06:37   Scoliosis xrays 09/11/2023 FINDINGS: Compared with prior examination there is no significant change in the loss of the cervical lordosis.   No change in the previously described 25 degrees scoliosis lumbar spine.   Previously described 11 degrees scoliosis thoracic spine not identified on these images.   No other significant findings.   IMPRESSION: No significant change in the scoliosis of the lumbar spine.     Electronically Signed   By: Shaaron Adler M.D.   On: 09/21/2023 13:01     I have personally reviewed the images and agree with the above interpretation.  Assessment and Plan: Claudia Johnson is a pleasant 43 y.o. female with a coronal plane deformity. She has a right L6 hemivertebra.  Her symptoms are relatively stable.  Will start some physical therapy for her left leg pain.  Her curve is stable.    Will follow-up with x-rays in 1 year.  I will see her back after her x-rays.  If her pain worsens in the meantime, we may need to get additional imaging.  This visit was done with the aid of a translator.    I spent a total of 15 minutes in this patient's care today. This time was spent reviewing pertinent records including imaging studies, obtaining and confirming history, performing a directed evaluation, formulating and discussing my recommendations, and documenting the visit within the medical record.    Thank you for involving me in the care of this patient.      Talley Casco K. Myer Haff MD, [facility] Neurosurgery

## 2023-11-05 ENCOUNTER — Other Ambulatory Visit: Payer: Self-pay

## 2023-11-05 ENCOUNTER — Emergency Department
Admission: EM | Admit: 2023-11-05 | Discharge: 2023-11-05 | Disposition: A | Discharge status: Home / Self Care | Payer: Self-pay | Attending: Emergency Medicine | Admitting: Emergency Medicine

## 2023-11-05 DIAGNOSIS — Y92410 Unspecified street and highway as the place of occurrence of the external cause: Secondary | ICD-10-CM | POA: Diagnosis not present

## 2023-11-05 DIAGNOSIS — S199XXA Unspecified injury of neck, initial encounter: Secondary | ICD-10-CM | POA: Diagnosis present

## 2023-11-05 DIAGNOSIS — S161XXA Strain of muscle, fascia and tendon at neck level, initial encounter: Secondary | ICD-10-CM | POA: Diagnosis not present

## 2023-11-05 MED ORDER — CYCLOBENZAPRINE HCL 5 MG PO TABS: 5.0000 mg | ORAL_TABLET | Freq: Three times a day (TID) | ORAL | 0 refills | Status: AC | PRN

## 2023-11-05 MED ORDER — LIDOCAINE 5 % EX PTCH
1.0000 | MEDICATED_PATCH | CUTANEOUS | Status: DC
  Administered 2023-11-05: 1 via TRANSDERMAL
  Filled 2023-11-05: qty 1

## 2023-11-05 MED ORDER — KETOROLAC TROMETHAMINE 30 MG/ML IJ SOLN
30.0000 mg | Freq: Once | INTRAMUSCULAR | Status: AC
  Administered 2023-11-05: 30 mg via INTRAMUSCULAR
  Filled 2023-11-05: qty 1

## 2023-11-05 MED ORDER — ACETAMINOPHEN 325 MG PO TABS
650.0000 mg | ORAL_TABLET | Freq: Once | ORAL | Status: AC
  Administered 2023-11-05: 650 mg via ORAL
  Filled 2023-11-05: qty 2
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
## 2024-01-21 ENCOUNTER — Telehealth: Payer: Self-pay | Admitting: *Deleted

## 2024-01-25 ENCOUNTER — Ambulatory Visit (INDEPENDENT_AMBULATORY_CARE_PROVIDER_SITE_OTHER): Payer: Self-pay | Admitting: Vascular Surgery

## 2024-01-25 ENCOUNTER — Encounter (INDEPENDENT_AMBULATORY_CARE_PROVIDER_SITE_OTHER): Payer: Self-pay | Admitting: Vascular Surgery

## 2024-01-25 VITALS — BP 126/82 | HR 67 | Ht 59.0 in | Wt 155.1 lb

## 2024-01-25 DIAGNOSIS — I83813 Varicose veins of bilateral lower extremities with pain: Secondary | ICD-10-CM

## 2024-01-26 ENCOUNTER — Encounter (INDEPENDENT_AMBULATORY_CARE_PROVIDER_SITE_OTHER): Payer: Self-pay | Admitting: Vascular Surgery

## 2024-01-26 NOTE — Progress Notes (Signed)
 Subjective:    Patient ID: Claudia Johnson, female    DOB: March 19, 1981, 43 y.o.   MRN: [id number] No chief complaint on file.   Patient presents to clinic today with chief complaint of varicose veins with pain to bilateral lower extremities.  Patient is Spanish-speaking only so there is an interpreter in the room interpreting for me today.  Patient states that she works on her feet all day long and over the last 6 months she has developed varicose veins that are painful both on the sides of her knees as well as the back of her knees.  She states the pain is stinging at times as well as throbbing and makes her legs feel tight.  She states that she has a difficult time working when the pain is really bad.  She endorses that she has not tried any conventional therapy.  She does not take anything for pain.  Her varicose veins were intermittent but now are constant.    Review of Systems  Constitutional: Negative.   Skin:  Positive for color change.       Positive varicose veins to bilateral lower extremities on the lateral side of the knees and behind the       Objective:   Physical Exam Vitals reviewed.  Constitutional:      Appearance: Normal appearance. She is obese.  HENT:     Head: Normocephalic.  Eyes:     Pupils: Pupils are equal, round, and reactive to light.  Cardiovascular:     Rate and Rhythm: Normal rate and regular rhythm.     Pulses: Normal pulses.     Heart sounds: Normal heart sounds.  Pulmonary:     Effort: Pulmonary effort is normal.     Breath sounds: Normal breath sounds.  Abdominal:     General: Abdomen is flat. Bowel sounds are normal.     Palpations: Abdomen is soft.  Musculoskeletal:        General: Tenderness present.     Comments: Bilateral lower extremity pain due to varicose veins at her level of her knees  Skin:    General: Skin is warm and dry.     Capillary Refill: Capillary refill takes 2 to 3 seconds.  Neurological:     General: No focal  deficit present.     Mental Status: She is alert and oriented to person, place, and time. Mental status is at baseline.  Psychiatric:        Mood and Affect: Mood normal.        Behavior: Behavior normal.        Thought Content: Thought content normal.        Judgment: Judgment normal.     BP 126/82   Pulse 67   Ht 4' 11 (1.499 m)   Wt 155 lb 2 oz (70.4 kg)   BMI 31.33 kg/m   Past Medical History:  Diagnosis Date   Scoliosis     Social History   Socioeconomic History   Marital status: Married    Spouse name: Not on file   Number of children: 3   Years of education: Not on file   Highest education level: 9th grade  Occupational History   Not on file  Tobacco Use   Smoking status: Never   Smokeless tobacco: Never  Vaping Use   Vaping status: Never Used  Substance and Sexual Activity   Alcohol use: Not Currently   Drug use: Never   Sexual activity: Yes  Birth control/protection: Surgical  Other Topics Concern   Not on file  Social History Narrative   Not on file   Social Drivers of Health   Financial Resource Strain: Low Risk  (01/13/2024)   Received from [facility]   Overall Financial Resource Strain (CARDIA)    Difficulty of Paying Living Expenses: Not hard at all  Food Insecurity: No Food Insecurity (01/13/2024)   Received from [facility]   Hunger Vital Sign    Within the past 12 months, you worried that your food would run out before you got the money to buy more.: Never true    Within the past 12 months, the food you bought just didn't last and you didn't have money to get more.: Never true  Transportation Needs: No Transportation Needs (01/13/2024)   Received from [facility] - Transportation    In the past 12 months, has lack of transportation kept you from medical appointments or from getting medications?: No    Lack of Transportation (Non-Medical): No  Physical Activity: Not on file   Stress: Not on file  Social Connections: Not on file  Intimate Partner Violence: Not on file    Past Surgical History:  Procedure Laterality Date   BREAST BIOPSY Right 11/05/2022   us  bx, venus marker, path pending   BREAST BIOPSY Right 11/05/2022   US  RT BREAST BX W LOC DEV 1ST LESION IMG BX SPEC US  GUIDE 11/05/2022 [facility]   TUBAL LIGATION      Family History  Problem Relation Age of Onset   Hypertension Father     No Known Allergies      No data to display            CMP  No results found for: NA, K, CL, CO2, GLUCOSE, BUN, CREATININE, CALCIUM, PROT, ALBUMIN, AST, ALT, ALKPHOS, BILITOT, GFR, EGFR, GFRNONAA   No results found.     Assessment & Plan:   1. Varicose veins of both lower extremities with pain (Primary)  Recommend:  The patient has large symptomatic varicose veins that are painful and associated with swelling. The patient is CEAP [id number]   I have had a long discussion with the patient regarding  varicose veins and why they cause symptoms.  Patient will begin wearing graduated compression stockings class 1 on a daily basis, beginning first thing in the morning and removing them in the evening. The patient is instructed specifically not to sleep in the stockings.    The patient  will also begin using over-the-counter analgesics such as Motrin 600 mg po TID to help control the symptoms.    In addition, behavioral modification including elevation during the day will be initiated.    Pending the results of these changes the  patient will be reevaluated in three months.   An ultrasound of the venous system will be obtained on follow up in 3 months.   Further plans will be based on the ultrasound results and whether conservative therapies are successful at eliminating the pain and swelling.    Current Outpatient Medications on File Prior to Visit  Medication Sig Dispense Refill   baclofen (LIORESAL) 10 MG tablet  Take 10 mg by mouth 3 (three) times daily.     cyclobenzaprine  (FLEXERIL ) 5 MG tablet Take 1 tablet (5 mg total) by mouth 3 (three) times daily as needed. 30 tablet 0   Vitamin D, Ergocalciferol, (DRISDOL) 1.25 MG (50000 UNIT) CAPS capsule Take  50,000 Units by mouth once a week.     No current facility-administered medications on file prior to visit.    There are no Patient Instructions on file for this visit. No follow-ups on file.   Gwendlyn JONELLE Shank, NP

## 2024-03-23 ENCOUNTER — Ambulatory Visit
Admission: RE | Admit: 2024-03-23 | Discharge: 2024-03-23 | Disposition: A | Discharge status: Home / Self Care | Payer: Self-pay | Source: Ambulatory Visit | Attending: Obstetrics and Gynecology | Admitting: Obstetrics and Gynecology

## 2024-03-23 DIAGNOSIS — Z1231 Encounter for screening mammogram for malignant neoplasm of breast: Secondary | ICD-10-CM | POA: Insufficient documentation

## 2024-04-22 ENCOUNTER — Other Ambulatory Visit (INDEPENDENT_AMBULATORY_CARE_PROVIDER_SITE_OTHER): Payer: Self-pay | Admitting: Vascular Surgery

## 2024-04-22 DIAGNOSIS — I83813 Varicose veins of bilateral lower extremities with pain: Secondary | ICD-10-CM

## 2024-04-25 ENCOUNTER — Ambulatory Visit (INDEPENDENT_AMBULATORY_CARE_PROVIDER_SITE_OTHER): Payer: Self-pay

## 2024-04-25 ENCOUNTER — Ambulatory Visit (INDEPENDENT_AMBULATORY_CARE_PROVIDER_SITE_OTHER): Payer: Self-pay | Admitting: Vascular Surgery

## 2024-04-25 ENCOUNTER — Encounter (INDEPENDENT_AMBULATORY_CARE_PROVIDER_SITE_OTHER): Payer: Self-pay | Admitting: Vascular Surgery

## 2024-04-25 ENCOUNTER — Encounter (INDEPENDENT_AMBULATORY_CARE_PROVIDER_SITE_OTHER): Payer: Self-pay

## 2024-04-25 VITALS — BP 120/85 | HR 66 | Ht 59.0 in | Wt 159.6 lb

## 2024-04-25 DIAGNOSIS — I83813 Varicose veins of bilateral lower extremities with pain: Secondary | ICD-10-CM

## 2024-04-28 ENCOUNTER — Encounter (INDEPENDENT_AMBULATORY_CARE_PROVIDER_SITE_OTHER): Payer: Self-pay | Admitting: Vascular Surgery

## 2024-04-28 NOTE — Progress Notes (Signed)
 Subjective:    Patient ID: Claudia Johnson, female    DOB: [birth year], 43 y.o.   MRN: [id number] Chief Complaint  Patient presents with   Follow-up    3 months + Reflux  Pain in left leg     Claudia Johnson is a 43 yo female that presents to the clinic today for 3 month follow-up bilateral lower extremity swelling with varicose veins.  Patient was last seen 3 months ago where we started conventional therapies such as compression, rest, elevation and exercise.  In conjunction with a Spanish interpreter today the patient endorses that she has not regularly worn compression.  However she did buy thigh-high and pantyhose compression to wear and has done so intermittently.  She endorses that her lower extremities are not as painful but they continue to be swollen.  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  Today she underwent bilateral lower extremity venous ultrasounds to rule out DVTs and reflux.  These were negative for any deep vein thrombosis but she is noted to have reflux in her right common femoral vein as well as in her distal mid and proximal thigh and her greater saphenous vein.  She is also noted to have reflux in her left common femoral vein as well as in her greater saphenous vein of the distal thigh and the knee.    Review of Systems  Cardiovascular:  Positive for leg swelling.  Musculoskeletal:  Positive for myalgias.  Skin:  Positive for color change.       Positive varicose veins with color changes.       Objective:   Physical Exam Constitutional:      Appearance: Normal appearance. She is obese.  HENT:     Head: Normocephalic.  Eyes:     Pupils: Pupils are equal, round, and reactive to light.  Cardiovascular:     Rate and Rhythm: Normal rate and regular rhythm.     Pulses: Normal pulses.     Heart sounds: Normal heart sounds.  Pulmonary:     Effort: Pulmonary effort is normal.     Breath sounds: Normal breath sounds.  Abdominal:     General:  Bowel sounds are normal.     Palpations: Abdomen is soft.  Musculoskeletal:     Right lower leg: Edema present.     Left lower leg: Edema present.  Skin:    General: Skin is warm and dry.     Capillary Refill: Capillary refill takes 2 to 3 seconds.  Neurological:     General: No focal deficit present.     Mental Status: She is alert and oriented to person, place, and time. Mental status is at baseline.  Psychiatric:        Mood and Affect: Mood normal.        Behavior: Behavior normal.        Thought Content: Thought content normal.        Judgment: Judgment normal.     BP 120/85   Pulse 66   Ht 4' 11 (1.499 m)   Wt 159 lb 9.6 oz (72.4 kg)   LMP 04/01/2024 (Approximate)   SpO2 (!) 18%   BMI 32.24 kg/m   Past Medical History:  Diagnosis Date   Scoliosis     Social History   Socioeconomic History   Marital status: Married    Spouse name: Not on file   Number of children: 3   Years of education: Not  on file   Highest education level: 9th grade  Occupational History   Not on file  Tobacco Use   Smoking status: Never   Smokeless tobacco: Never  Vaping Use   Vaping status: Never Used  Substance and Sexual Activity   Alcohol use: Not Currently   Drug use: Never   Sexual activity: Yes    Birth control/protection: Surgical  Other Topics Concern   Not on file  Social History Narrative   Not on file   Social Drivers of Health   Financial Resource Strain: Low Risk  (01/13/2024)   Received from [facility]   Overall Financial Resource Strain (CARDIA)    Difficulty of Paying Living Expenses: Not hard at all  Food Insecurity: No Food Insecurity (01/13/2024)   Received from [facility]   Hunger Vital Sign    Within the past 12 months, you worried that your food would run out before you got the money to buy more.: Never true    Within the past 12 months, the food you bought just didn't last and you didn't have money to get more.:  Never true  Transportation Needs: No Transportation Needs (01/13/2024)   Received from [facility] - Transportation    In the past 12 months, has lack of transportation kept you from medical appointments or from getting medications?: No    Lack of Transportation (Non-Medical): No  Physical Activity: Not on file  Stress: Not on file  Social Connections: Not on file  Intimate Partner Violence: Not on file    Past Surgical History:  Procedure Laterality Date   BREAST BIOPSY Right 11/05/2022   us  bx, venus marker, path pending   BREAST BIOPSY Right 11/05/2022   US  RT BREAST BX W LOC DEV 1ST LESION IMG BX SPEC US  GUIDE 11/05/2022 [facility]   TUBAL LIGATION      Family History  Problem Relation Age of Onset   Hypertension Father    Breast cancer Neg Hx     No Known Allergies      No data to display            CMP  No results found for: NA, K, CL, CO2, GLUCOSE, BUN, CREATININE, CALCIUM, PROT, ALBUMIN, AST, ALT, ALKPHOS, BILITOT, GFR, EGFR, GFRNONAA   No results found.     Assessment & Plan:   1. Varicose veins of both lower extremities with pain (Primary) Patient currently has had some success with intermittently wearing her compression stockings/hose.  She is not as painful related to her varicose veins but she continues to have them.  Today she underwent bilateral venous lower extremity ultrasounds which does show she has reflux at both her thighs.  However she continues to be morbidly obese and has not lost any weight today.  Most of her weight is in her thighs and her hips which I feel is contributing to her reflux and her varicose veins.  Vascular surgery recommends at this time that she continue with conventional therapies such as compression, rest, elevation and exercise.  I recommended that she do this consistently on a daily basis and do not sleep in the compression.  I also recommended that she  follow-up with her primary care physician concerning weight loss and the need weight loss drugs.  Weight loss at this time most likely help with the reduction of some of the reflux to her thighs.  Patient will follow-up with vein and vascular services in  6 months for reevaluation of bilateral lower extremity swelling and varicose veins to her upper thighs.  We will also reevaluate her weight at that time.  No repeat studies were ordered today.  This was all explained and discussed with the patient in conjunction with a Spanish interpreter.  Patient verbalized her understanding and wishes to proceed with the plan.   Current Outpatient Medications on File Prior to Visit  Medication Sig Dispense Refill   cyclobenzaprine  (FLEXERIL ) 5 MG tablet Take 1 tablet (5 mg total) by mouth 3 (three) times daily as needed. 30 tablet 0   Vitamin D, Ergocalciferol, (DRISDOL) 1.25 MG (50000 UNIT) CAPS capsule Take 50,000 Units by mouth once a week.     baclofen (LIORESAL) 10 MG tablet Take 10 mg by mouth 3 (three) times daily. (Patient not taking: Reported on 04/25/2024)     No current facility-administered medications on file prior to visit.    There are no Patient Instructions on file for this visit. No follow-ups on file.   Gwendlyn JONELLE Shank, NP

## 2024-09-20 ENCOUNTER — Ambulatory Visit: Payer: Self-pay | Admitting: Neurosurgery

## 2025-04-25 ENCOUNTER — Ambulatory Visit (INDEPENDENT_AMBULATORY_CARE_PROVIDER_SITE_OTHER): Payer: Self-pay | Admitting: Vascular Surgery

## 2025-04-25 ENCOUNTER — Encounter (INDEPENDENT_AMBULATORY_CARE_PROVIDER_SITE_OTHER): Payer: Self-pay
